# Patient Record
Sex: Female | Born: 1965 | Hispanic: Yes | Marital: Married | Smoking: Never smoker
Health system: Southern US, Community
[De-identification: ages and names within clinical notes are randomized; demographics above are authoritative.]

## PROBLEM LIST (undated history)

## (undated) DIAGNOSIS — K760 Fatty (change of) liver, not elsewhere classified: Secondary | ICD-10-CM

## (undated) HISTORY — PX: CHOLECYSTECTOMY: SHX55

---

## 2006-03-18 ENCOUNTER — Ambulatory Visit: Payer: Self-pay

## 2007-08-17 ENCOUNTER — Ambulatory Visit: Payer: Self-pay

## 2008-09-05 ENCOUNTER — Ambulatory Visit: Payer: Self-pay

## 2009-11-26 ENCOUNTER — Ambulatory Visit: Payer: Self-pay

## 2011-02-17 ENCOUNTER — Ambulatory Visit: Payer: Self-pay

## 2014-05-16 ENCOUNTER — Ambulatory Visit: Payer: Self-pay

## 2016-01-08 ENCOUNTER — Ambulatory Visit
Admission: RE | Admit: 2016-01-08 | Discharge: 2016-01-08 | Disposition: A | Discharge status: Home / Self Care | Payer: Self-pay | Source: Ambulatory Visit | Attending: Oncology | Admitting: Oncology

## 2016-01-08 ENCOUNTER — Ambulatory Visit: Payer: Self-pay | Attending: Oncology

## 2016-01-08 VITALS — BP 104/68 | HR 70 | Temp 97.6°F | Resp 16 | Ht 63.78 in | Wt 178.1 lb

## 2016-01-08 DIAGNOSIS — Z0000 Encounter for general adult medical examination without abnormal findings: Secondary | ICD-10-CM

## 2016-01-08 NOTE — Progress Notes (Signed)
Subjective:     Patient ID: Deanna Odom, female   DOB: 03/31/1966, 50 y.o.   MRN: [id number]  HPI   Review of Systems     Objective:   Physical Exam  Pulmonary/Chest: Right breast exhibits no inverted nipple, no mass, no nipple discharge, no skin change and no tenderness. Left breast exhibits no inverted nipple, no mass, no nipple discharge, no skin change and no tenderness. Breasts are symmetrical.       Assessment:     50 year old  [facility]  Patient screened, and meets BCCCP eligibility.  Patient does not have insurance, Medicare or Medicaid.  Handout given on Affordable Care Act. Instructed patient on breast self-exam using teach back method.  CBE unremarkable.  Patient has 3 children,and 2 grandchildren.  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  Delos HaringLoyda Murr interpreted exam.    Plan:     Sent for bilateral screening mammogram.

## 2016-01-15 NOTE — Progress Notes (Signed)
Letter mailed from [facility] to notify of normal mammogram results.  Patient to return in one year for annual screening.  Copy to [facility]. 

## 2016-02-16 ENCOUNTER — Emergency Department: Payer: No Typology Code available for payment source

## 2016-02-16 ENCOUNTER — Emergency Department
Admission: EM | Admit: 2016-02-16 | Discharge: 2016-02-16 | Disposition: A | Discharge status: Home / Self Care | Payer: No Typology Code available for payment source | Attending: Emergency Medicine | Admitting: Emergency Medicine

## 2016-02-16 ENCOUNTER — Encounter: Payer: Self-pay | Admitting: Emergency Medicine

## 2016-02-16 DIAGNOSIS — Y9389 Activity, other specified: Secondary | ICD-10-CM | POA: Diagnosis not present

## 2016-02-16 DIAGNOSIS — Y999 Unspecified external cause status: Secondary | ICD-10-CM | POA: Diagnosis not present

## 2016-02-16 DIAGNOSIS — M25552 Pain in left hip: Secondary | ICD-10-CM | POA: Diagnosis not present

## 2016-02-16 DIAGNOSIS — S5012XA Contusion of left forearm, initial encounter: Secondary | ICD-10-CM | POA: Insufficient documentation

## 2016-02-16 DIAGNOSIS — Y92410 Unspecified street and highway as the place of occurrence of the external cause: Secondary | ICD-10-CM | POA: Diagnosis not present

## 2016-02-16 DIAGNOSIS — M545 Low back pain: Secondary | ICD-10-CM | POA: Insufficient documentation

## 2016-02-16 DIAGNOSIS — T148XXA Other injury of unspecified body region, initial encounter: Secondary | ICD-10-CM

## 2016-02-16 DIAGNOSIS — M25512 Pain in left shoulder: Secondary | ICD-10-CM | POA: Diagnosis not present

## 2016-02-16 DIAGNOSIS — R0789 Other chest pain: Secondary | ICD-10-CM | POA: Diagnosis present

## 2016-02-16 DIAGNOSIS — S0031XA Abrasion of nose, initial encounter: Secondary | ICD-10-CM | POA: Diagnosis not present

## 2016-02-16 DIAGNOSIS — M25551 Pain in right hip: Secondary | ICD-10-CM | POA: Diagnosis not present

## 2016-02-16 HISTORY — DX: Fatty (change of) liver, not elsewhere classified: K76.0

## 2016-02-16 LAB — CBC WITH DIFFERENTIAL/PLATELET
BASOS ABS: 0.1 10*3/uL (ref 0–0.1)
Basophils Relative: 1 %
EOS ABS: 0 10*3/uL (ref 0–0.7)
EOS PCT: 0 %
HCT: 37.3 % (ref 35.0–47.0)
Hemoglobin: 13.1 g/dL (ref 12.0–16.0)
LYMPHS ABS: 3.1 10*3/uL (ref 1.0–3.6)
Lymphocytes Relative: 23 %
MCH: 32.1 pg (ref 26.0–34.0)
MCHC: 35.1 g/dL (ref 32.0–36.0)
MCV: 91.4 fL (ref 80.0–100.0)
MONO ABS: 1 10*3/uL — AB (ref 0.2–0.9)
Monocytes Relative: 8 %
Neutro Abs: 9.4 10*3/uL — ABNORMAL HIGH (ref 1.4–6.5)
Neutrophils Relative %: 68 %
PLATELETS: 286 10*3/uL (ref 150–440)
RBC: 4.08 MIL/uL (ref 3.80–5.20)
RDW: 13.4 % (ref 11.5–14.5)
WBC: 13.5 10*3/uL — AB (ref 3.6–11.0)

## 2016-02-16 LAB — COMPREHENSIVE METABOLIC PANEL
ALT: 55 U/L — AB (ref 14–54)
ANION GAP: 9 (ref 5–15)
AST: 66 U/L — ABNORMAL HIGH (ref 15–41)
Albumin: 4.1 g/dL (ref 3.5–5.0)
Alkaline Phosphatase: 70 U/L (ref 38–126)
BUN: 10 mg/dL (ref 6–20)
CHLORIDE: 103 mmol/L (ref 101–111)
CO2: 23 mmol/L (ref 22–32)
CREATININE: 0.67 mg/dL (ref 0.44–1.00)
Calcium: 8.9 mg/dL (ref 8.9–10.3)
Glucose, Bld: 140 mg/dL — ABNORMAL HIGH (ref 65–99)
POTASSIUM: 3.2 mmol/L — AB (ref 3.5–5.1)
SODIUM: 135 mmol/L (ref 135–145)
Total Bilirubin: 0.7 mg/dL (ref 0.3–1.2)
Total Protein: 7.5 g/dL (ref 6.5–8.1)

## 2016-02-16 LAB — POCT PREGNANCY, URINE: PREG TEST UR: NEGATIVE

## 2016-02-16 LAB — ETHANOL: ALCOHOL ETHYL (B): 66 mg/dL — AB (ref <5)

## 2016-02-16 MED ORDER — MORPHINE SULFATE (PF) 2 MG/ML IV SOLN
2.0000 mg | Freq: Once | INTRAVENOUS | Status: AC
  Administered 2016-02-16: 2 mg via INTRAVENOUS
  Filled 2016-02-16: qty 1

## 2016-02-16 MED ORDER — IBUPROFEN 800 MG PO TABS: 800.0000 mg | ORAL_TABLET | Freq: Three times a day (TID) | ORAL | Status: AC | PRN

## 2016-02-16 MED ORDER — ONDANSETRON HCL 4 MG/2ML IJ SOLN
4.0000 mg | Freq: Once | INTRAMUSCULAR | Status: AC
  Administered 2016-02-16: 4 mg via INTRAVENOUS
  Filled 2016-02-16: qty 2

## 2016-02-16 MED ORDER — SODIUM CHLORIDE 0.9 % IV BOLUS (SEPSIS)
1000.0000 mL | Freq: Once | INTRAVENOUS | Status: AC
  Administered 2016-02-16: 1000 mL via INTRAVENOUS

## 2016-02-16 MED ORDER — HYDROCODONE-ACETAMINOPHEN 5-325 MG PO TABS: 1.0000 | ORAL_TABLET | Freq: Four times a day (QID) | ORAL | Status: AC | PRN

## 2016-02-16 MED ORDER — IOPAMIDOL (ISOVUE-300) INJECTION 61%
100.0000 mL | Freq: Once | INTRAVENOUS | Status: AC | PRN
  Administered 2016-02-16: 100 mL via INTRAVENOUS
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
## 2016-02-16 NOTE — ED Notes (Signed)
Patient transported to CT 
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
## 2018-04-27 ENCOUNTER — Other Ambulatory Visit: Payer: Self-pay | Admitting: Orthopedic Surgery

## 2018-04-27 DIAGNOSIS — M5412 Radiculopathy, cervical region: Secondary | ICD-10-CM

## 2018-05-08 ENCOUNTER — Ambulatory Visit
Admission: RE | Admit: 2018-05-08 | Discharge: 2018-05-08 | Disposition: A | Discharge status: Home / Self Care | Payer: Self-pay | Source: Ambulatory Visit | Attending: Orthopedic Surgery | Admitting: Orthopedic Surgery

## 2018-05-08 DIAGNOSIS — M47892 Other spondylosis, cervical region: Secondary | ICD-10-CM | POA: Insufficient documentation

## 2018-05-08 DIAGNOSIS — M5412 Radiculopathy, cervical region: Secondary | ICD-10-CM | POA: Insufficient documentation

## 2021-04-15 ENCOUNTER — Other Ambulatory Visit: Payer: Self-pay

## 2021-04-15 ENCOUNTER — Encounter: Payer: Self-pay | Admitting: *Deleted

## 2021-04-15 ENCOUNTER — Ambulatory Visit
Admission: RE | Admit: 2021-04-15 | Discharge: 2021-04-15 | Disposition: A | Discharge status: Home / Self Care | Payer: Self-pay | Source: Ambulatory Visit | Attending: Oncology | Admitting: Oncology

## 2021-04-15 ENCOUNTER — Ambulatory Visit: Payer: Self-pay | Attending: Oncology | Admitting: *Deleted

## 2021-04-15 VITALS — BP 157/98 | HR 75 | Temp 97.9°F | Ht 63.39 in | Wt 206.2 lb

## 2021-04-15 DIAGNOSIS — Z0000 Encounter for general adult medical examination without abnormal findings: Secondary | ICD-10-CM

## 2021-04-15 NOTE — Patient Instructions (Signed)
Gave patient hand-out, Women Staying Healthy, Active and Well from [facility], with education on breast health, pap smears, heart and colon health. 

## 2021-04-15 NOTE — Progress Notes (Signed)
  Subjective:     Patient ID: Deanna Odom, female   DOB: 05/21/1966, 55 y.o.   MRN: [id number]  HPI  [facility] Medical History Record - 04/15/21 0957       Breast History   Screening cycle New    Provider (CBE) [facility]    Initial Mammogram 04/15/21    Last Mammogram Annual    Last Mammogram Date 04/09/16    Provider (Mammogram)  [facility]    Recent Breast Symptoms None      Breast Cancer History   Breast Cancer History No personal or family history      Previous History of Breast Problems   Breast Surgery or Biopsy None    Breast Implants N/A    BSE Done Monthly      Gynecological/Obstetrical History   LMP --   November 2020   Is there any chance that the client could be pregnant?  No    Age at menarche 69    Age at menopause 83    PAP smear history Annually    Family history of Cervial, Uterine or Ovarian cancer Yes   Mother dx 58;              Review of Systems     Objective:   Physical Exam Chest:  Breasts:    Right: No swelling, bleeding, inverted nipple, mass, nipple discharge, skin change or tenderness.     Left: No swelling, bleeding, inverted nipple, mass, nipple discharge, skin change or tenderness.  Lymphadenopathy:     Upper Body:     Right upper body: No supraclavicular or axillary adenopathy.     Left upper body: No supraclavicular or axillary adenopathy.      Assessment:     55 year old female returns to [facility] for annual screening.   Deanna Odom, the [employer] interpreter present during the interview and exam.  Clinical breast exam unremarkable.  Taught self breast awareness.  Last pap per patient was at the [facility] in 2020.  Next pap will be due per ASCCP guidelines.  Patient with family history of ovarian cancer in her mom.  Discussed referral to genetic testing due to family history, but patient was not interested.  Encouraged importance of annual screening.  Patient has been screened for eligibility.  She does not have any  insurance, Medicare or Medicaid.  She also meets financial eligibility.   Risk Assessment     Risk Scores       04/15/2021   Last edited by: Scarlett Presto, RN   5-year risk: 0.5 %   Lifetime risk: 3.9 %               Plan:     Screening mammogram ordered.  Will follow up per [facility] protocol.

## 2021-04-16 ENCOUNTER — Encounter: Payer: Self-pay | Admitting: Internal Medicine

## 2021-06-15 NOTE — Progress Notes (Signed)
Letter mailed from [facility] to notify of normal mammogram results.  Patient to return in one year for annual screening.  Copy to [facility]. 

## 2022-10-23 ENCOUNTER — Other Ambulatory Visit: Payer: Self-pay

## 2022-10-23 DIAGNOSIS — Z1231 Encounter for screening mammogram for malignant neoplasm of breast: Secondary | ICD-10-CM

## 2022-11-16 ENCOUNTER — Ambulatory Visit
Admission: RE | Admit: 2022-11-16 | Discharge: 2022-11-16 | Disposition: A | Discharge status: Home / Self Care | Payer: Self-pay | Source: Ambulatory Visit | Attending: Obstetrics and Gynecology | Admitting: Obstetrics and Gynecology

## 2022-11-16 ENCOUNTER — Ambulatory Visit: Payer: Self-pay | Attending: Hematology and Oncology | Admitting: Hematology and Oncology

## 2022-11-16 VITALS — BP 151/78 | Wt 210.5 lb

## 2022-11-16 DIAGNOSIS — Z1231 Encounter for screening mammogram for malignant neoplasm of breast: Secondary | ICD-10-CM | POA: Insufficient documentation

## 2022-11-16 MED ORDER — CEPHALEXIN 500 MG PO CAPS: 500.0000 mg | ORAL_CAPSULE | Freq: Two times a day (BID) | ORAL | 0 refills | Status: AC

## 2022-11-16 MED ORDER — NYSTATIN 100000 UNIT/GM EX POWD: 1.0000 | Freq: Three times a day (TID) | CUTANEOUS | 3 refills | Status: AC

## 2022-11-16 NOTE — Patient Instructions (Signed)
Taught Deanna Odom about self breast awareness and gave educational materials to take home. Patient did not need a Pap smear today due to last Pap smear was in 2020 per patient. Let her know [facility] will cover Pap smears every 5 years unless has a history of abnormal Pap smears. [facility] to [facility] for diagnostic mammogram. Appointment scheduled for 11/16/22. Patient aware of appointment and will be there. Let patient know will follow up with her within the next couple weeks with results. Deanna Odom verbalized understanding.  Pascal Lux, NP 10:02 AM

## 2022-11-16 NOTE — Progress Notes (Signed)
[name] is a 57 y.o. female who presents to [facility] clinic today with no complaints.    Pap Smear: Pap not smear completed today. Last Pap smear was 2024 at [facility] clinic and was normal. Per patient has no history of an abnormal Pap smear. Last Pap smear result is not available in Epic.   Physical exam: Breasts Breasts symmetrical. No skin abnormalities bilateral breasts. No nipple retraction bilateral breasts. No nipple discharge bilateral breasts. No lymphadenopathy. No lumps palpated bilateral breasts.  MS DIGITAL SCREENING TOMO BILATERAL  Result Date: 04/18/2021 CLINICAL DATA:  Screening. EXAM: DIGITAL SCREENING BILATERAL MAMMOGRAM WITH TOMOSYNTHESIS AND CAD TECHNIQUE: Bilateral screening digital craniocaudal and mediolateral oblique mammograms were obtained. Bilateral screening digital breast tomosynthesis was performed. The images were evaluated with computer-aided detection. COMPARISON:  Previous exam(s). ACR Breast Density Category b: There are scattered areas of fibroglandular density. FINDINGS: There are no findings suspicious for malignancy. IMPRESSION: No mammographic evidence of malignancy. A result letter of this screening mammogram will be mailed directly to the patient. RECOMMENDATION: Screening mammogram in one year. (Code:SM-B-01Y) BI-RADS CATEGORY  1: Negative. Electronically Signed   By: Ted Mcalpine M.D.   On: 04/18/2021 16:58        Pelvic/Bimanual Pap is not indicated today    Smoking History: Patient has never smoked and was not referred to quit line.    Patient Navigation: Patient education provided. Access to services provided for patient through [facility] program. Delos Haring interpreter provided. No transportation provided   Colorectal Cancer Screening: Per patient has never had colonoscopy completed No complaints today. FIT test given March 2024   Breast and Cervical Cancer Risk Assessment: Patient does not have family history of  breast cancer, known genetic mutations, or radiation treatment to the chest before age 33. Patient does not have history of cervical dysplasia, immunocompromised, or DES exposure in-utero.  Risk Assessment   No risk assessment data for the current encounter  Risk Scores       04/15/2021   Last edited by: Scarlett Presto, RN   5-year risk: 0.5 %   Lifetime risk: 3.9 %            A: [facility] exam without pap smear No complaints with benign exam.   P: Referred patient to the [facility] Center for a screening mammogram. Appointment scheduled 11/16/22.  Deanna Odom A, NP 11/16/2022 10:01 AM

## 2023-02-01 IMAGING — MG MM DIGITAL SCREENING BILAT W/ TOMO AND CAD
8 series · 8 of 24 positions shown · non-contrast
Comparison: Previous exam(s).

CLINICAL DATA: Screening.

EXAM:
DIGITAL SCREENING BILATERAL MAMMOGRAM WITH TOMOSYNTHESIS AND CAD
TECHNIQUE: Bilateral screening digital craniocaudal and mediolateral oblique
mammograms were obtained. Bilateral screening digital breast
tomosynthesis was performed. The images were evaluated with
computer-aided detection.

[R MLO synth-2D]
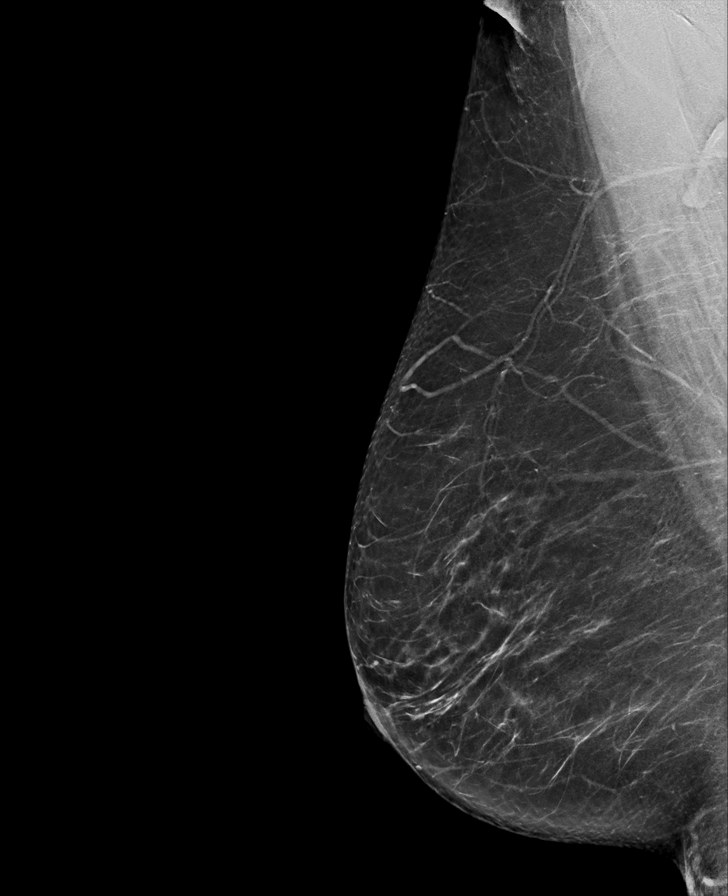

[L MLO synth-2D]
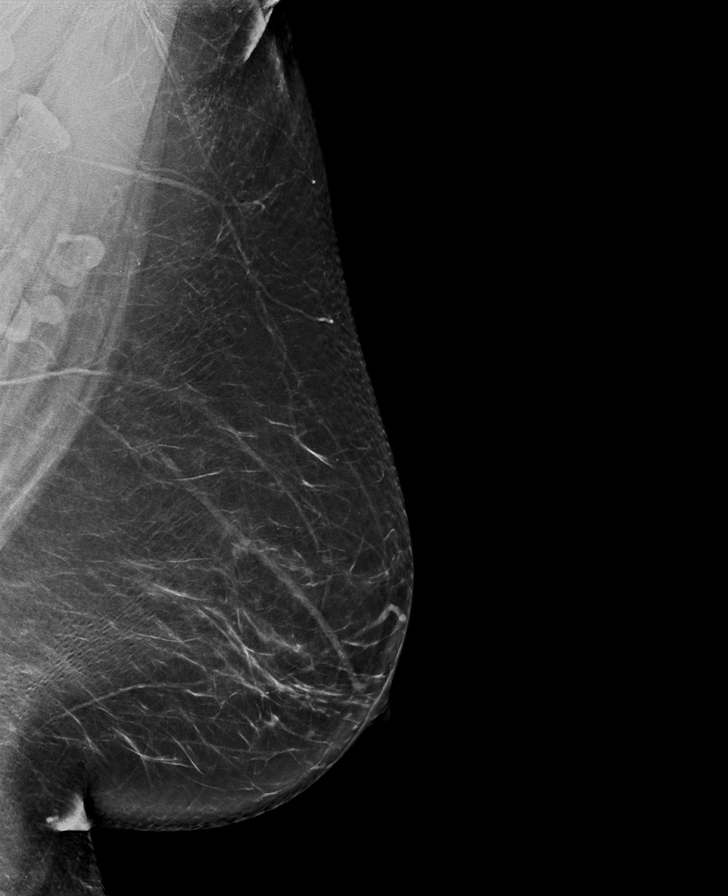

[L CC synth-2D]
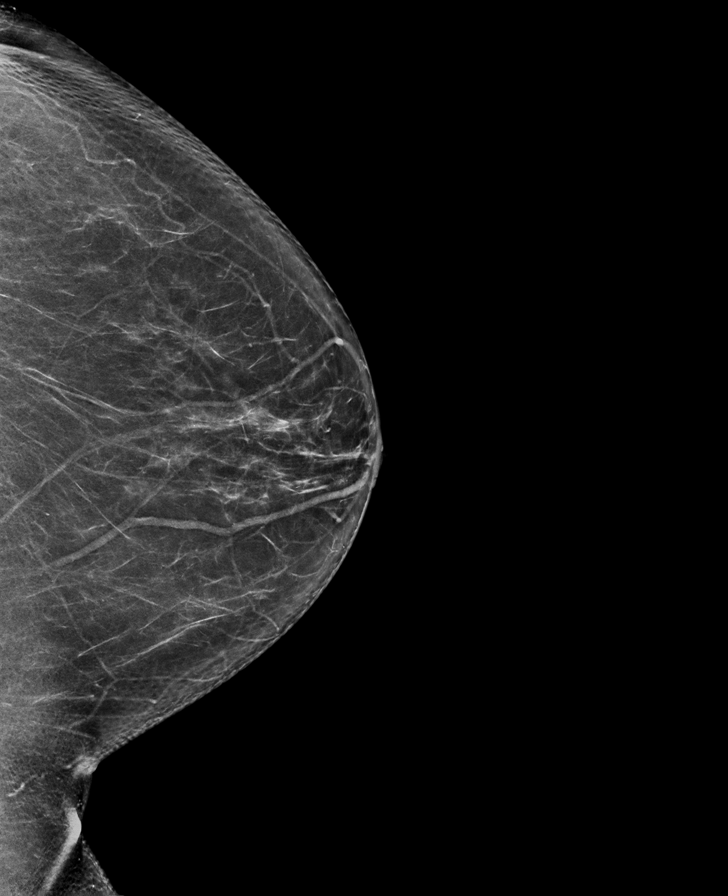

[R CC synth-2D]
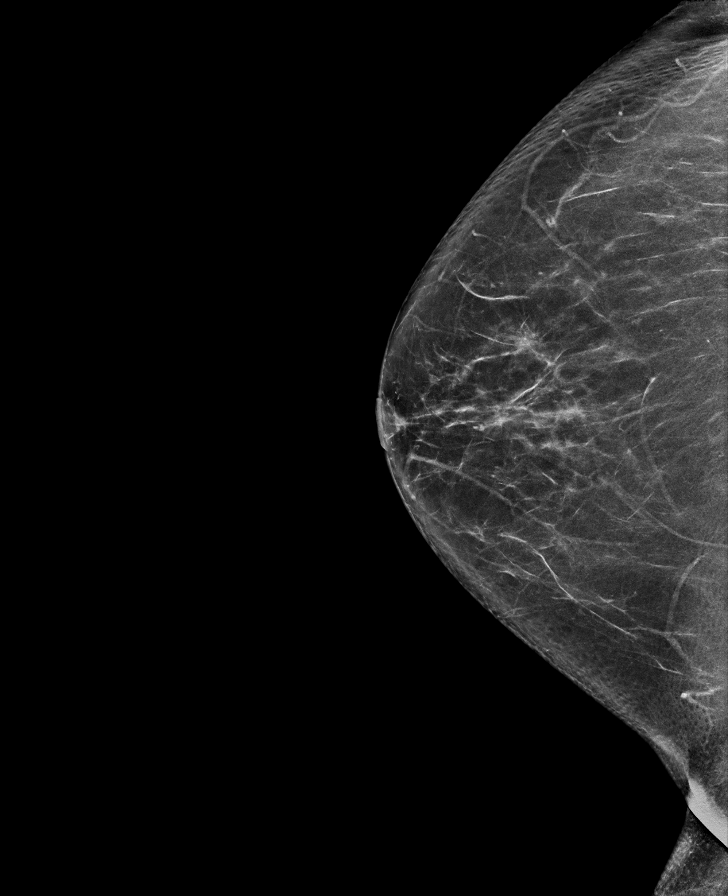

[R CC tomo · tomo slice 37/73.0]
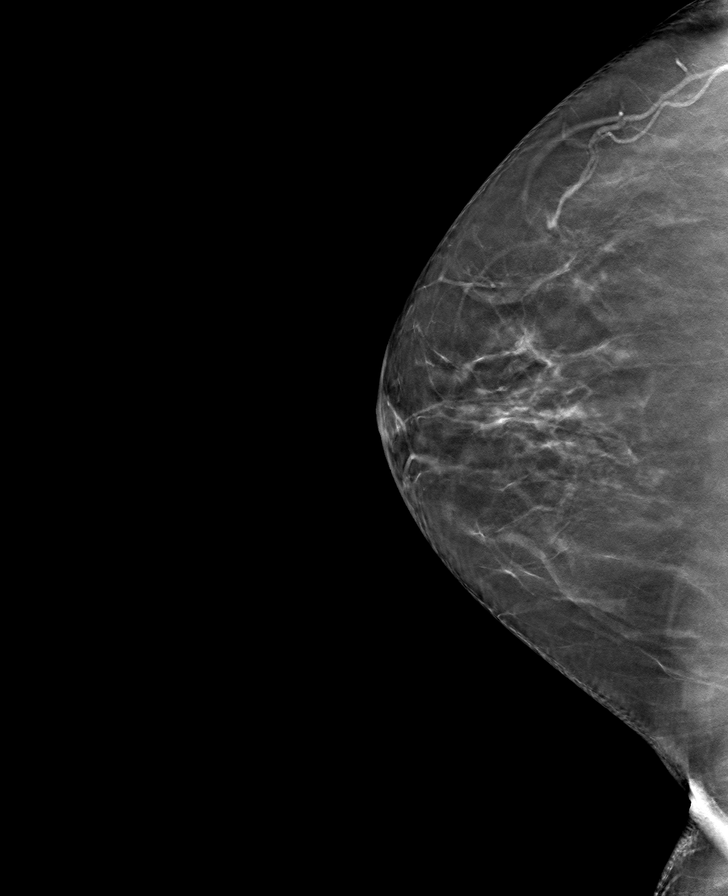

[L MLO tomo · tomo slice 43/85.0]
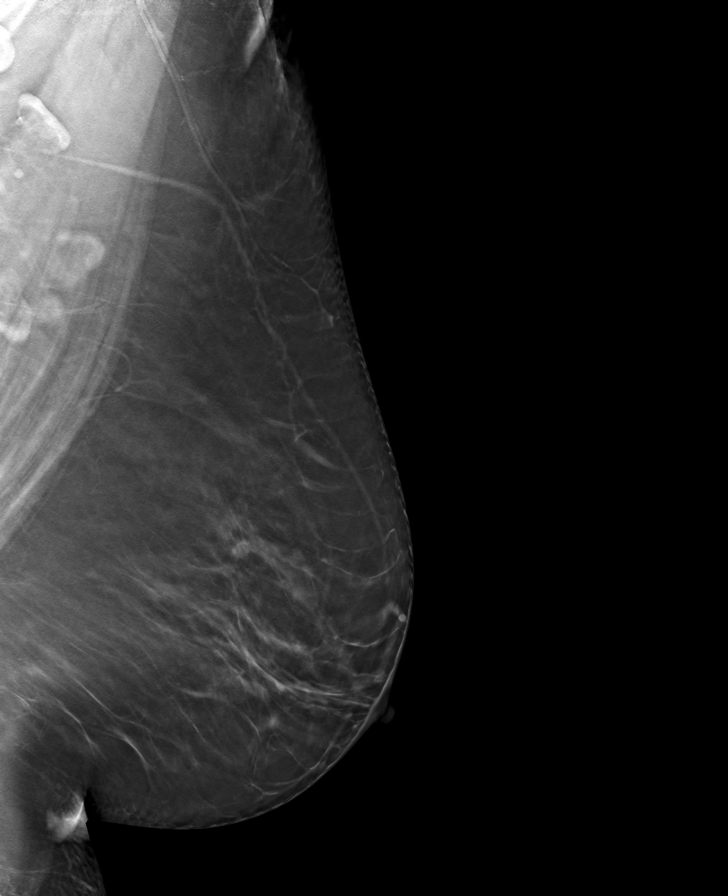

[R MLO tomo · tomo slice 39/78.0]
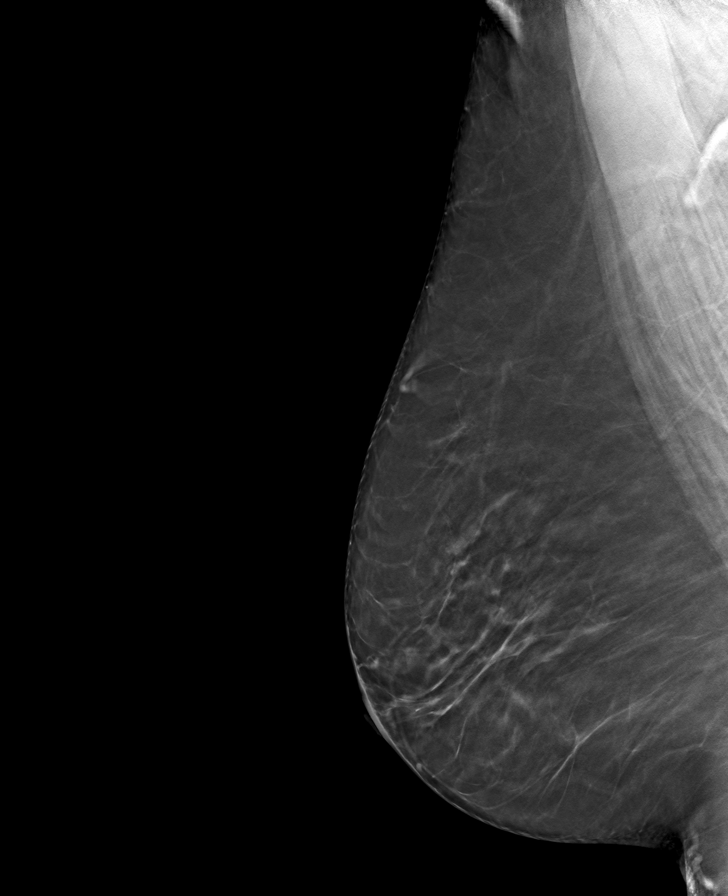

[L CC tomo · tomo slice 37/74.0]
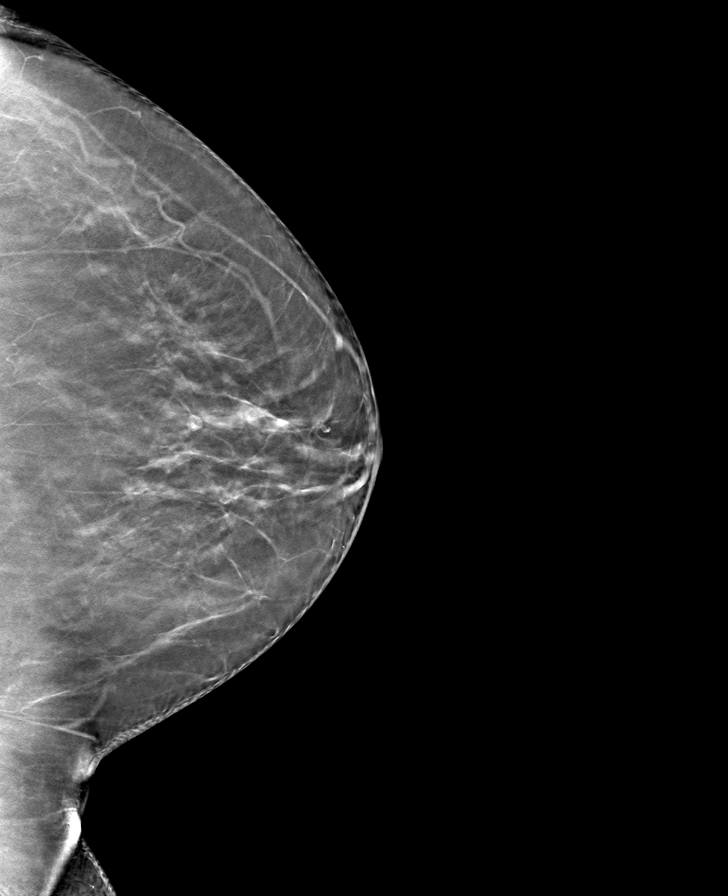

[8 of 24 positions shown; findings below may reference images not displayed]

ACR Breast Density Category b: There are scattered areas of
fibroglandular density.
FINDINGS: There are no findings suspicious for malignancy.
IMPRESSION: No mammographic evidence of malignancy. A result letter of this
screening mammogram will be mailed directly to the patient.

RECOMMENDATION:
Screening mammogram in one year. (Code:51-O-LD2)

BI-RADS CATEGORY  1: Negative.

## 2024-02-18 ENCOUNTER — Other Ambulatory Visit: Payer: Self-pay

## 2024-02-18 DIAGNOSIS — Z1231 Encounter for screening mammogram for malignant neoplasm of breast: Secondary | ICD-10-CM

## 2024-03-13 ENCOUNTER — Ambulatory Visit: Payer: Self-pay

## 2024-05-01 ENCOUNTER — Ambulatory Visit: Payer: Self-pay | Attending: Obstetrics and Gynecology | Admitting: *Deleted

## 2024-05-01 ENCOUNTER — Ambulatory Visit
Admission: RE | Admit: 2024-05-01 | Discharge: 2024-05-01 | Disposition: A | Discharge status: Home / Self Care | Payer: Self-pay | Source: Ambulatory Visit | Attending: Obstetrics and Gynecology | Admitting: Obstetrics and Gynecology

## 2024-05-01 VITALS — BP 145/80 | Ht 63.0 in | Wt 203.8 lb

## 2024-05-01 DIAGNOSIS — Z1239 Encounter for other screening for malignant neoplasm of breast: Secondary | ICD-10-CM

## 2024-05-01 DIAGNOSIS — Z1231 Encounter for screening mammogram for malignant neoplasm of breast: Secondary | ICD-10-CM | POA: Insufficient documentation

## 2024-05-01 NOTE — Progress Notes (Signed)
 [name] is a 58 y.o. female who presents to [facility] clinic today with no complaints.    Pap Smear: Pap smear not completed today. Last Pap smear was 10/06/2022 at [facility] clinic and was normal with negative HPV. Per patient has no history of an abnormal Pap smear. Last Pap smear result is available in Epic.   Physical exam: Breasts Breasts symmetrical. No skin abnormalities bilateral breasts. No nipple retraction bilateral breasts. No nipple discharge bilateral breasts. No lymphadenopathy. No lumps palpated bilateral breasts. No complaints of pain or tenderness on exam.     MS 3D SCR MAMMO BILAT BR (aka MM) Result Date: 11/17/2022 CLINICAL DATA:  Screening. EXAM: DIGITAL SCREENING BILATERAL MAMMOGRAM WITH TOMOSYNTHESIS AND CAD TECHNIQUE: Bilateral screening digital craniocaudal and mediolateral oblique mammograms were obtained. Bilateral screening digital breast tomosynthesis was performed. The images were evaluated with computer-aided detection. COMPARISON:  Previous exam(s). ACR Breast Density Category b: There are scattered areas of fibroglandular density. FINDINGS: There are no findings suspicious for malignancy. IMPRESSION: No mammographic evidence of malignancy. A result letter of this screening mammogram will be mailed directly to the patient. RECOMMENDATION: Screening mammogram in one year. (Code:SM-B-01Y) BI-RADS CATEGORY  1: Negative. Electronically Signed   By: Deanna Odom M.D.   On: 11/17/2022 13:54   MS DIGITAL SCREENING TOMO BILATERAL Result Date: 04/18/2021 CLINICAL DATA:  Screening. EXAM: DIGITAL SCREENING BILATERAL MAMMOGRAM WITH TOMOSYNTHESIS AND CAD TECHNIQUE: Bilateral screening digital craniocaudal and mediolateral oblique mammograms were obtained. Bilateral screening digital breast tomosynthesis was performed. The images were evaluated with computer-aided detection. COMPARISON:  Previous exam(s). ACR Breast Density Category b: There are scattered areas  of fibroglandular density. FINDINGS: There are no findings suspicious for malignancy. IMPRESSION: No mammographic evidence of malignancy. A result letter of this screening mammogram will be mailed directly to the patient. RECOMMENDATION: Screening mammogram in one year. (Code:SM-B-01Y) BI-RADS CATEGORY  1: Negative. Electronically Signed   By: Deanna  Odom M.D.   On: 04/18/2021 16:58  Pelvic/Bimanual Pap is not indicated today per BCCCP guidelines.   Smoking History: Patient has never smoked.   Patient Navigation: Patient education provided. Access to services provided for patient through [employer] program. Spanish interpreter Deanna Odom from [facility] provided.   Colorectal Cancer Screening: Per patient has never had colonoscopy completed. Per patient completed the Cologuard test given by her PCP in August 2025 that was negative. No complaints today.    Breast and Cervical Cancer Risk Assessment: Patient does not have family history of breast cancer, known genetic mutations, or radiation treatment to the chest before age 46. Patient does not have history of cervical dysplasia, immunocompromised, or DES exposure in-utero.  Risk Scores as of Encounter on 05/01/2024     Deanna Odom as of 11/16/2022           5-year 0.81%   Lifetime 5.35%   This patient is Hisp[place]/Latina but has no documented birth country, so the [place] model used data from [place] patients to calculate their risk score. Document a birth country in the Demographics activity for a more accurate score.         Last calculated by Deanna Tempie SQUIBB, LPN on 5/84/7975 at 10:12 AM        A: [facility]CP exam without pap smear No complaints.  P: Referred patient to the [facility] for a screening mammogram. Appointment scheduled Monday, May 01, 2024 at 1200.  Deanna Wanda SQUIBB, RN 05/01/2024 11:06 AM

## 2024-05-01 NOTE — Patient Instructions (Signed)
 Explained breast self awareness with [facility]. Patient did not need a Pap smear today due to last Pap smear and HPV typing was 10/06/2022. Let her know BCCCP will cover Pap smears and HPV typing every 5 years unless has a history of abnormal Pap smears. Referred patient to the [facility] for a screening mammogram. Appointment scheduled Monday, May 01, 2024 at 1200. Patient aware of appointment and will be there. Let patient know Raymondo will follow up with her within the next couple weeks with results of her mammogram by letter or phone. Rhaya Callanan verbalized understanding.  Breanda Greenlaw, Wanda Ship, RN 11:06 AM

## 2024-05-03 ENCOUNTER — Other Ambulatory Visit: Payer: Self-pay | Admitting: Obstetrics and Gynecology

## 2024-05-03 DIAGNOSIS — R928 Other abnormal and inconclusive findings on diagnostic imaging of breast: Secondary | ICD-10-CM

## 2024-05-10 ENCOUNTER — Inpatient Hospital Stay
Admission: RE | Admit: 2024-05-10 | Discharge: 2024-05-10 | Payer: Self-pay | Attending: Obstetrics and Gynecology | Admitting: Obstetrics and Gynecology

## 2024-05-10 ENCOUNTER — Ambulatory Visit
Admission: RE | Admit: 2024-05-10 | Discharge: 2024-05-10 | Disposition: A | Discharge status: Home / Self Care | Payer: Self-pay | Source: Ambulatory Visit | Attending: Obstetrics and Gynecology | Admitting: Obstetrics and Gynecology

## 2024-05-10 DIAGNOSIS — R928 Other abnormal and inconclusive findings on diagnostic imaging of breast: Secondary | ICD-10-CM
